# Patient Record
Sex: Female | Born: 2000 | Hispanic: Yes | Marital: Single | Smoking: Never smoker
Health system: Southern US, Community
[De-identification: ages and names within clinical notes are randomized; demographics above are authoritative.]

## PROBLEM LIST (undated history)

## (undated) DIAGNOSIS — R519 Headache, unspecified: Secondary | ICD-10-CM

## (undated) DIAGNOSIS — R55 Syncope and collapse: Secondary | ICD-10-CM

## (undated) HISTORY — DX: Headache, unspecified: R51.9

## (undated) HISTORY — DX: Syncope and collapse: R55

## (undated) HISTORY — PX: OTHER SURGICAL HISTORY: SHX169

---

## 2019-05-27 ENCOUNTER — Ambulatory Visit: Payer: Medicaid Other | Attending: Internal Medicine

## 2019-05-27 DIAGNOSIS — Z23 Encounter for immunization: Secondary | ICD-10-CM

## 2019-05-27 NOTE — Progress Notes (Signed)
   Covid-19 Vaccination Clinic  Name:  Janasia Coverdale    MRN: [id number] DOB: 04-06-2000  05/27/2019  Ms. Henricks was observed post Covid-19 immunization for 15 minutes without incident. She was provided with Vaccine Information Sheet and instruction to access the V-Safe system.   Ms. Sleep was instructed to call 911 with any severe reactions post vaccine: Marland Kitchen Difficulty breathing  . Swelling of face and throat  . A fast heartbeat  . A bad rash all over body  . Dizziness and weakness   Immunizations Administered    Name Date Dose VIS Date Route   Pfizer COVID-19 Vaccine 05/27/2019  4:46 PM 0.3 mL 03/31/2018 Intramuscular   Manufacturer: [employer], [employer]   Lot: [id number]   NDC: [address]-5

## 2019-06-21 ENCOUNTER — Ambulatory Visit: Payer: Medicaid Other | Attending: Internal Medicine

## 2019-06-21 DIAGNOSIS — Z23 Encounter for immunization: Secondary | ICD-10-CM

## 2019-06-21 NOTE — Progress Notes (Signed)
   Covid-19 Vaccination Clinic  Name:  Kaila Devries    MRN: [id number] DOB: [birth year]  06/21/2019  Ms. Saddler was observed post Covid-19 immunization for 15 minutes without incident. She was provided with Vaccine Information Sheet and instruction to access the V-Safe system.   Ms. Ord was instructed to call 911 with any severe reactions post vaccine: Marland Kitchen Difficulty breathing  . Swelling of face and throat  . A fast heartbeat  . A bad rash all over body  . Dizziness and weakness   Immunizations Administered    Name Date Dose VIS Date Route   Pfizer COVID-19 Vaccine 06/21/2019  4:20 PM 0.3 mL 03/31/2018 Intramuscular   Manufacturer: [employer], [employer]   Lot: [id number]   NDC: [address]-3

## 2020-01-18 ENCOUNTER — Ambulatory Visit (INDEPENDENT_AMBULATORY_CARE_PROVIDER_SITE_OTHER): Payer: Medicaid Other

## 2020-01-18 ENCOUNTER — Ambulatory Visit (INDEPENDENT_AMBULATORY_CARE_PROVIDER_SITE_OTHER): Payer: Medicaid Other | Admitting: Cardiology

## 2020-01-18 ENCOUNTER — Other Ambulatory Visit: Payer: Self-pay

## 2020-01-18 ENCOUNTER — Encounter: Payer: Self-pay | Admitting: Cardiology

## 2020-01-18 ENCOUNTER — Ambulatory Visit: Payer: Medicaid Other

## 2020-01-18 VITALS — BP 102/64 | HR 78 | Ht 67.0 in | Wt 167.6 lb

## 2020-01-18 DIAGNOSIS — R55 Syncope and collapse: Secondary | ICD-10-CM

## 2020-01-18 NOTE — Patient Instructions (Signed)
Medication Instructions:  Your physician recommends that you continue on your current medications as directed. Please refer to the Current Medication list given to you today.  *If you need a refill on your cardiac medications before your next appointment, please call your pharmacy*   Lab Work: NONE If you have labs (blood work) drawn today and your tests are completely normal, you will receive your results only by:  MyChart Message (if you have MyChart) OR  A paper copy in the mail If you have any lab test that is abnormal or we need to change your treatment, we will call you to review the results.   Testing/Procedures: echocardiogram    Follow-Up: At [facility], you and your health needs are our priority.  As part of our continuing mission to provide you with exceptional heart care, we have created designated Provider Care Teams.  These Care Teams include your primary Cardiologist (physician) and Advanced Practice Providers (APPs -  Physician Assistants and Nurse Practitioners) who all work together to provide you with the care you need, when you need it.  We recommend signing up for the patient portal called "MyChart".  Sign up information is provided on this After Visit Summary.  MyChart is used to connect with patients for Virtual Visits (Telemedicine).  Patients are able to view lab/test results, encounter notes, upcoming appointments, etc.  Non-urgent messages can be sent to your provider as well.   To learn more about what you can do with MyChart, go to [email or web address].    Your next appointment:   10 week(s)  The format for your next appointment:   In Person  Provider:   Thomasene Ripple, DO   Other Instructions

## 2020-01-19 NOTE — Progress Notes (Signed)
Cardiology Office Note:    Date:  01/19/2020   ID:  Kristina Malone, DOB 2000-05-09, MRN [id number]  PCP:  Buckner Malta, MD  Cardiologist:  Thomasene Ripple, DO  Electrophysiologist:  None   Referring MD: Buckner Malta, MD   " I passed out at the doctor's office"  History of Present Illness:    Kristina Malone is a 19 y.o. female with a hx of having multiple syncope episodes in the past as a child.  She did wear a monitor and follow-up with [facility] at that time.  She notes that since being a teenager she did not have any repeated syncope episode.  But noted that recently now she is had been experiencing syncope episode when she get blood drawn, and also has had other instances where she had syncope episode.  She tells me that recently she had an episode where she had her first pelvic exam at her PCP office.  During the procedure she felt the sensation and knew that she was going to pass out she did tell her PCP that she felt as if she was going to pass out and she was level down on the bed.  She tells me that her PCP witnessed the event and thought this may have been a seizure.  The patient tells me that her PCP sent her to the hospital for EEG and this was reported to be normal.  Is unclear if she really has seen neurology. She has no chest pain, no shortness of breath.  History reviewed. No pertinent past medical history.  Past Surgical History:  Procedure Laterality Date  . NO PAST SURGICAL HISTORY      Current Medications: No outpatient medications have been marked as taking for the 01/18/20 encounter (Office Visit) with Thomasene Ripple, DO.     Allergies:   Patient has no known allergies.   Social History   Socioeconomic History  . Marital status: Single    Spouse name: Not on file  . Number of children: Not on file  . Years of education: Not on file  . Highest education level: Not on file  Occupational History  . Not on file  Tobacco Use  . Smoking  status: Never Smoker  . Smokeless tobacco: Never Used  Substance and Sexual Activity  . Alcohol use: Not Currently  . Drug use: Never  . Sexual activity: Not on file  Other Topics Concern  . Not on file  Social History Narrative  . Not on file   Social Determinants of Health   Financial Resource Strain: Not on file  Food Insecurity: Not on file  Transportation Needs: Not on file  Physical Activity: Not on file  Stress: Not on file  Social Connections: Not on file     Family History: The patient's Family history is unknown by patient.  ROS:   Review of Systems  Constitution: Recent syncope episode.  Negative for decreased appetite, fever and weight gain.  HENT: Negative for congestion, ear discharge, hoarse voice and sore throat.   Eyes: Negative for discharge, redness, vision loss in right eye and visual halos.  Cardiovascular: Negative for chest pain, dyspnea on exertion, leg swelling, orthopnea and palpitations.  Respiratory: Negative for cough, hemoptysis, shortness of breath and snoring.   Endocrine: Negative for heat intolerance and polyphagia.  Hematologic/Lymphatic: Negative for bleeding problem. Does not bruise/bleed easily.  Skin: Negative for flushing, nail changes, rash and suspicious lesions.  Musculoskeletal: Negative for arthritis, joint pain, muscle cramps, myalgias, neck  pain and stiffness.  Gastrointestinal: Negative for abdominal pain, bowel incontinence, diarrhea and excessive appetite.  Genitourinary: Negative for decreased libido, genital sores and incomplete emptying.  Neurological: Negative for brief paralysis, focal weakness, headaches and loss of balance.  Psychiatric/Behavioral: Negative for altered mental status, depression and suicidal ideas.  Allergic/Immunologic: Negative for HIV exposure and persistent infections.    EKGs/Labs/Other Studies Reviewed:    The following studies were reviewed today:   EKG:  The ekg ordered today demonstrates  sinus rhythm, heart rate 78 bpm with sinus arrhythmia.   Recent Labs: No results found for requested labs within last 8760 hours.  Recent Lipid Panel No results found for: CHOL, TRIG, HDL, CHOLHDL, VLDL, LDLCALC, LDLDIRECT  Physical Exam:    VS:  BP 102/64 (BP Location: Left Arm)   Pulse 78   Ht 5\' 7"  (1.702 m)   Wt 167 lb 9.6 oz (76 kg)   SpO2 99%   BMI 26.25 kg/m     Wt Readings from Last 3 Encounters:  01/18/20 167 lb 9.6 oz (76 kg)     GEN: Well nourished, well developed in no acute distress HEENT: Normal NECK: No JVD; No carotid bruits LYMPHATICS: No lymphadenopathy CARDIAC: S1S2 noted,RRR, no murmurs, rubs, gallops RESPIRATORY:  Clear to auscultation without rales, wheezing or rhonchi  ABDOMEN: Soft, non-tender, non-distended, +bowel sounds, no guarding. EXTREMITIES: No edema, No cyanosis, no clubbing MUSCULOSKELETAL:  No deformity  SKIN: Warm and dry NEUROLOGIC:  Alert and oriented x 3, non-focal PSYCHIATRIC:  Normal affect, good insight  ASSESSMENT:    1. Syncope and collapse    PLAN:     1.  Her syncope events does sound vasovagal however I would like to rule out a cardiovascular etiology of this syncope, therefore at this time I would like to placed a zio patch for 14 days. In additon a transthoracic echocardiogram will be ordered to assess LV/RV function and any structural abnormalities. Once these testing have been performed amd reviewed further reccomendations will be made. For now, I do reccomend that the patient goes to the nearest ED if  symptoms recur.  The patient is in agreement with the above plan. The patient left the office in stable condition.  The patient will follow up in 3 months or sooner if needed.   Medication Adjustments/Labs and Tests Ordered: Current medicines are reviewed at length with the patient today.  Concerns regarding medicines are outlined above.  Orders Placed This Encounter  Procedures  . LONG TERM MONITOR-LIVE TELEMETRY  (3-14 DAYS)  . EKG 12-Lead  . ECHOCARDIOGRAM COMPLETE   No orders of the defined types were placed in this encounter.   Patient Instructions  Medication Instructions:  Your physician recommends that you continue on your current medications as directed. Please refer to the Current Medication list given to you today.  *If you need a refill on your cardiac medications before your next appointment, please call your pharmacy*   Lab Work: NONE If you have labs (blood work) drawn today and your tests are completely normal, you will receive your results only by: 01/20/20 MyChart Message (if you have MyChart) OR . A paper copy in the mail If you have any lab test that is abnormal or we need to change your treatment, we will call you to review the results.   Testing/Procedures: echocardiogram    Follow-Up: At [facility], you and your health needs are our priority.  As part of our continuing mission to provide you with exceptional heart  care, we have created designated Provider Care Teams.  These Care Teams include your primary Cardiologist (physician) and Advanced Practice Providers (APPs -  Physician Assistants and Nurse Practitioners) who all work together to provide you with the care you need, when you need it.  We recommend signing up for the patient portal called "MyChart".  Sign up information is provided on this After Visit Summary.  MyChart is used to connect with patients for Virtual Visits (Telemedicine).  Patients are able to view lab/test results, encounter notes, upcoming appointments, etc.  Non-urgent messages can be sent to your provider as well.   To learn more about what you can do with MyChart, go to [email or web address].    Your next appointment:   10 week(s)  The format for your next appointment:   In Person  Provider:   Thomasene Ripple, DO   Other Instructions      Adopting a Healthy Lifestyle.  Know what a healthy weight is for you (roughly BMI <25) and aim  to maintain this   Aim for 7+ servings of fruits and vegetables daily   65-80+ fluid ounces of water or unsweet tea for healthy kidneys   Limit to max 1 drink of alcohol per day; avoid smoking/tobacco   Limit animal fats in diet for cholesterol and heart health - choose grass fed whenever available   Avoid highly processed foods, and foods high in saturated/trans fats   Aim for low stress - take time to unwind and care for your mental health   Aim for 150 min of moderate intensity exercise weekly for heart health, and weights twice weekly for bone health   Aim for 7-9 hours of sleep daily   When it comes to diets, agreement about the perfect plan isnt easy to find, even among the experts. Experts at the [facility] [employer] developed an idea known as the Healthy Eating Plate. Just imagine a plate divided into logical, healthy portions.   The emphasis is on diet quality:   Load up on vegetables and fruits - one-half of your plate: Aim for color and variety, and remember that potatoes dont count.   Go for whole grains - one-quarter of your plate: Whole wheat, barley, wheat berries, quinoa, oats, brown rice, and foods made with them. If you want pasta, go with whole wheat pasta.   Protein power - one-quarter of your plate: Fish, chicken, beans, and nuts are all healthy, versatile protein sources. Limit red meat.   The diet, however, does go beyond the plate, offering a few other suggestions.   Use healthy Malone oils, such as olive, canola, soy, corn, sunflower and peanut. Check the labels, and avoid partially hydrogenated oil, which have unhealthy trans fats.   If youre thirsty, drink water. Coffee and tea are good in moderation, but skip sugary drinks and limit milk and dairy products to one or two daily servings.   The type of carbohydrate in the diet is more important than the amount. Some sources of carbohydrates, such as vegetables, fruits, whole grains, and  beans-are healthier than others.   Finally, stay active  Signed, Thomasene Ripple, DO  01/19/2020 9:43 AM    [facility]

## 2020-02-15 ENCOUNTER — Other Ambulatory Visit: Payer: Self-pay | Admitting: Cardiology

## 2020-02-15 DIAGNOSIS — R55 Syncope and collapse: Secondary | ICD-10-CM

## 2020-02-18 ENCOUNTER — Telehealth: Payer: Self-pay

## 2020-02-18 NOTE — Telephone Encounter (Signed)
-----   Message from Thomasene Ripple, DO sent at 02/17/2020 10:32 PM EST ----- Have the patient come earlier than feb to discuss her testing results.

## 2020-02-18 NOTE — Telephone Encounter (Signed)
Left message on patients voicemail to please return our call.   

## 2020-02-23 ENCOUNTER — Ambulatory Visit (INDEPENDENT_AMBULATORY_CARE_PROVIDER_SITE_OTHER): Payer: Medicaid Other

## 2020-02-23 ENCOUNTER — Other Ambulatory Visit: Payer: Self-pay

## 2020-02-23 DIAGNOSIS — R55 Syncope and collapse: Secondary | ICD-10-CM

## 2020-02-23 LAB — ECHOCARDIOGRAM COMPLETE
Area-P 1/2: 3.6 cm2
S' Lateral: 3 cm

## 2020-02-23 NOTE — Progress Notes (Signed)
Complete echocardiogram performed.  Jimmy Yina Riviere RDCS, RVT  

## 2020-02-24 ENCOUNTER — Telehealth: Payer: Self-pay | Admitting: Cardiology

## 2020-02-24 NOTE — Telephone Encounter (Signed)
Patient was returning call to receive her results. Please call back

## 2020-02-24 NOTE — Telephone Encounter (Signed)
The patient has been notified of the result and verbalized understanding.  All questions (if any) were answered. Loa Socks, LPN 5/52/1747 1:59 PM

## 2020-02-24 NOTE — Telephone Encounter (Signed)
-----   Message from Thomasene Ripple, DO sent at 02/24/2020  1:11 PM EST ----- Echo normal

## 2020-03-06 ENCOUNTER — Other Ambulatory Visit: Payer: Self-pay

## 2020-03-07 ENCOUNTER — Ambulatory Visit: Payer: Medicaid Other | Admitting: Cardiology

## 2020-03-13 ENCOUNTER — Other Ambulatory Visit: Payer: Self-pay

## 2020-03-13 ENCOUNTER — Encounter: Payer: Self-pay | Admitting: Neurology

## 2020-03-13 ENCOUNTER — Ambulatory Visit: Payer: Medicaid Other | Admitting: Neurology

## 2020-03-13 VITALS — BP 116/74 | HR 75 | Ht 67.0 in | Wt 166.6 lb

## 2020-03-13 DIAGNOSIS — R4020 Unspecified coma: Secondary | ICD-10-CM | POA: Diagnosis not present

## 2020-03-13 DIAGNOSIS — R55 Syncope and collapse: Secondary | ICD-10-CM

## 2020-03-13 NOTE — Patient Instructions (Signed)
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  She was advised to keep her self well-hydrated and avoid stressful situations which provoke these episodes.  She was advised not to drive for 6 months since her last episode as per [facility].  She will return for follow-up in the future in 3 months or call earlier if necessary.  Syncope Syncope is when you pass out (faint) for a short time. It is caused by a sudden decrease in blood flow to the brain. Signs that you may be about to pass out include:  Feeling dizzy or light-headed.  Feeling sick to your stomach (nauseous).  Seeing all white or all black.  Having cold, clammy skin. If you pass out, get help right away. Call your local emergency services (911 in the [place] Do not drive yourself to the hospital. Follow these instructions at home: Watch for any changes in your symptoms. Take these actions to stay safe and help with your symptoms: Lifestyle  Do not drive, use machinery, or play sports until your doctor says it is okay.  Do not drink alcohol.  Do not use any products that contain nicotine or tobacco, such as cigarettes and e-cigarettes. If you need help quitting, ask your doctor.  Drink enough fluid to keep your pee (urine) pale yellow. General instructions  Take over-the-counter and prescription medicines only as told by your doctor.  If you are taking blood pressure or heart medicine, sit up and stand up slowly. Spend a few minutes getting ready to sit and then stand. This can help you feel less dizzy.  Have someone stay with you until you feel stable.  If you start to feel like you might pass out, lie down right away and raise (elevate)  your feet above the level of your heart. Breathe deeply and steadily. Wait until all of the symptoms are gone.  Keep all follow-up visits as told by your doctor. This is important. Get help right away if:  You have a very bad headache.  You pass out once or more than once.  You have pain in your chest, belly, or back.  You have a very fast or uneven heartbeat (palpitations).  It hurts to breathe.  You are bleeding from your mouth or your bottom (rectum).  You have black or tarry poop (stool).  You have jerky movements that you cannot control (seizure).  You are confused.  You have trouble walking.  You are very weak.  You have vision problems. These symptoms may be an emergency. Do not wait to see if the symptoms will go away. Get medical help right away. Call your local emergency services (911 in the [place] Do not drive yourself to the hospital. Summary  Syncope is when you pass out (faint) for a short time. It is caused by a sudden decrease in blood flow to the brain.  Signs that you may be about to faint include feeling dizzy, light-headed, or sick to your stomach, seeing all white or all black, or having cold, clammy skin.  If you start to feel like you might pass out, lie down right away and raise (elevate) your feet above the level of your heart. Breathe deeply and steadily. Wait until  all of the symptoms are gone. This information is not intended to replace advice given to you by your health care provider. Make sure you discuss any questions you have with your health care provider. Document Revised: 03/03/2019 Document Reviewed: 03/05/2017 Elsevier Patient Education  2021 [employer].

## 2020-03-13 NOTE — Progress Notes (Addendum)
[facility] [address] [place]. [place] [address] [phone]       OFFICE CONSULT NOTE  [name] Date of Birth:  2000-03-09 Medical Record Number:  [id number]   Referring MD: Bonna Gains Reason for Referral: Seizure  HPI: [name] is a pleasant 20 year old Hispanic lady seen for initial office consultation visit for seizure.  History is obtained from the patient and review of referral notes and have reviewed electronic medical records.  Patient states she had a recent episode in December 2021 when she was at her primary care physician's office having a pelvic exam with and after the exam she passed out.  This episode was more prolonged than prior episodes and the nurse practitioner witnessed some jerking activity raising concerns for seizure prompting this referral.  Patient states she remembers exactly what happened and she felt uncomfortable and had been feeling quite anxious and nervous prior to the pelvic exam.  She regained consciousness fairly quickly and had a clear mind and denied any disc disorientation, confusion or headache.  Patient states she has had multiple previous episodes of syncope.  Most of these episodes occur when she is in a stressful situation like site of needles or having labs drawn.  She has an aura and start feeling hot and dizzy and nervous and know she is going to pass out.  Patient states that she had a somewhat similar episode in 2013 when she remembers she was worked up at [facility] but I do not have those records.  She tells me that she wore a heart monitor for 4 weeks which was negative as well as had an EEG which was supposedly normal.  He has not been evaluated by neurologist and does not remember having a brain MRI done.  She did have an echocardiogram done on 02/23/2020 in [place] which was normal as well as wore a 2-week Holter monitor on 02/15/2020 for 2 weeks which showed no significant worrisome arrhythmias.   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  She denies any prior history of strokes, TIAs, seizures, migraines or significant head injury resulting in loss of consciousness.  There is no family history of epilepsy or seizures. ROS:   14 system review of systems is positive for passing out, shaking, seizure-like activity, feeling hot and dizzy and all other systems negative  PMH:  Past Medical History:  Diagnosis Date  . Headache     Social History:  Social History   Socioeconomic History  . Marital status: Single    Spouse name: Not on file  . Number of children: Not on file  . Years of education: Not on file  . Highest education level: Not on file  Occupational History  . 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 Smoking status: Never Smoker  . Smokeless tobacco: Never Used  Substance and Sexual Activity  . Alcohol use: Not Currently  . Drug use: Never  . Sexual activity: Not on file  Other Topics Concern  . Not on file  Social History Narrative   Lives with mom and stepdad   Left Handed   Drinks 1-2 cups daily   Social Determinants of Health   Financial Resource Strain: Not on file  Food Insecurity: Not on file  Transportation Needs: Not on file  Physical Activity: Not on file  Stress: Not on file  Social Connections: Not on file  Intimate Partner Violence: Not on file  Medications:   Current Outpatient Medications on File Prior to Visit  Medication Sig Dispense Refill  . ibuprofen (ADVIL) 800 MG tablet Take 800 mg by mouth 2 (two) times daily as needed.     No current facility-administered medications on file prior to visit.    Allergies:  No Known Allergies  Physical Exam General: well developed, well nourished young Hispanic lady, seated, in no evident distress Head: head normocephalic and atraumatic.   Neck: supple with no carotid or supraclavicular bruits Cardiovascular: regular rate and rhythm, no  murmurs Musculoskeletal: no deformity Skin:  no rash/petichiae Vascular:  Normal pulses all extremities  Neurologic Exam Mental Status: Awake and fully alert. Oriented to place and time. Recent and remote memory intact. Attention span, concentration and fund of knowledge appropriate. Mood and affect appropriate.  Cranial Nerves: Fundoscopic exam reveals sharp disc margins. Pupils equal, briskly reactive to light. Extraocular movements full without nystagmus. Visual fields full to confrontation. Hearing intact. Facial sensation intact. Face, tongue, palate moves normally and symmetrically.  Motor: Normal bulk and tone. Normal strength in all tested extremity muscles. Sensory.: intact to touch , pinprick , position and vibratory sensation.  Coordination: Rapid alternating movements normal in all extremities. Finger-to-nose and heel-to-shin performed accurately bilaterally. Gait and Station: Arises from chair without difficulty. Stance is normal. Gait demonstrates normal stride length and balance . Able to heel, toe and tandem walk without difficulty.  Reflexes: 1+ and symmetric. Toes downgoing.       ASSESSMENT: 20 year old Hispanic lady with a longstanding history of syncopal episodes with recent episode of witnessed convulsive syncope.  Most of the episodes seem to have typical triggers for vasovagal syncope.  Doubt this represents seizure.     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  She was advised to keep her self well-hydrated and avoid stressful situations which provoke these episodes.  She was advised not to drive for 6 months since her last episode as per [facility].  She will return for  follow-up in the future in 3 months or call earlier if necessary.  Greater than 50% time during this 45-minute consultation visit was spent on counseling and coordination of care about her syncope and convulsive syncope and answering questions. Delia Heady, MD  [facility] [address] [place], [place] [address]  Phone [phone] Fax [phone] Note: This document was prepared with digital dictation and possible smart phrase technology. Any transcriptional errors that result from this process are unintentional.

## 2020-03-14 ENCOUNTER — Telehealth: Payer: Self-pay | Admitting: Neurology

## 2020-03-14 NOTE — Telephone Encounter (Signed)
[facility] [place]: [phone], [phone] & [phone] (exp. 03/14/20 to 04/28/20) order sent to GI. They will reach out to the patient to schedule.

## 2020-03-15 ENCOUNTER — Ambulatory Visit (INDEPENDENT_AMBULATORY_CARE_PROVIDER_SITE_OTHER): Payer: Medicaid Other | Admitting: Neurology

## 2020-03-15 ENCOUNTER — Other Ambulatory Visit: Payer: Self-pay

## 2020-03-15 DIAGNOSIS — R55 Syncope and collapse: Secondary | ICD-10-CM | POA: Diagnosis not present

## 2020-03-15 DIAGNOSIS — R4020 Unspecified coma: Secondary | ICD-10-CM

## 2020-03-21 NOTE — Progress Notes (Signed)
Kindly inform the patient that EEG study was normal

## 2020-03-23 ENCOUNTER — Telehealth: Payer: Self-pay | Admitting: *Deleted

## 2020-03-23 NOTE — Telephone Encounter (Signed)
Spoke with patient and advised per Dr Pearlean Brownie that her EEG was normal. Pt verbalized understanding and appreciation for the call.

## 2020-03-25 ENCOUNTER — Ambulatory Visit
Admission: RE | Admit: 2020-03-25 | Discharge: 2020-03-25 | Disposition: A | Discharge status: Home / Self Care | Payer: Medicaid Other | Source: Ambulatory Visit | Attending: Neurology | Admitting: Neurology

## 2020-03-25 ENCOUNTER — Other Ambulatory Visit: Payer: Self-pay

## 2020-03-25 DIAGNOSIS — R55 Syncope and collapse: Secondary | ICD-10-CM | POA: Diagnosis not present

## 2020-03-25 MED ORDER — GADOBENATE DIMEGLUMINE 529 MG/ML IV SOLN
15.0000 mL | Freq: Once | INTRAVENOUS | Status: AC | PRN
  Administered 2020-03-25: 15 mL via INTRAVENOUS

## 2020-03-27 ENCOUNTER — Telehealth: Payer: Self-pay | Admitting: Emergency Medicine

## 2020-03-27 NOTE — Telephone Encounter (Signed)
Called and reached VM, LVM with Dr. Marlis Edelson review and findings of MRI's per DPR.  Left office number to call back if she had any additional questions.

## 2020-03-27 NOTE — Progress Notes (Signed)
Kindly inform the patient that MRI scan of the brain was pretty unremarkable and did not show anything to worry about.  MR angiogram study of the blood vessels in the neck and the brain also did not show any major blockages or aneurysms to worry about.

## 2020-03-27 NOTE — Telephone Encounter (Signed)
-----   Message from Micki Riley, MD sent at 03/27/2020  8:39 AM EST ----- Kindly inform the patient that MRI scan of the brain was pretty unremarkable and did not show anything to worry about.  MR angiogram study of the blood vessels in the neck and the brain also did not show any major blockages or aneurysms to worry about.

## 2020-03-30 ENCOUNTER — Other Ambulatory Visit: Payer: Self-pay

## 2020-03-30 DIAGNOSIS — R519 Headache, unspecified: Secondary | ICD-10-CM | POA: Insufficient documentation

## 2020-04-03 ENCOUNTER — Encounter: Payer: Self-pay | Admitting: Cardiology

## 2020-04-03 ENCOUNTER — Other Ambulatory Visit: Payer: Self-pay

## 2020-04-03 ENCOUNTER — Ambulatory Visit (INDEPENDENT_AMBULATORY_CARE_PROVIDER_SITE_OTHER): Payer: Medicaid Other | Admitting: Cardiology

## 2020-04-03 VITALS — BP 116/68 | HR 60 | Ht 67.0 in | Wt 167.4 lb

## 2020-04-03 DIAGNOSIS — R55 Syncope and collapse: Secondary | ICD-10-CM | POA: Diagnosis not present

## 2020-04-03 NOTE — Patient Instructions (Signed)

## 2020-04-03 NOTE — Progress Notes (Signed)
Cardiology Office Note:    Date:  04/03/2020   ID:  Kristina Malone, DOB 2000/04/09, MRN [id number]  PCP:  Buckner Malta, MD  Cardiologist:  Thomasene Ripple, DO  Electrophysiologist:  None   Referring MD: Buckner Malta, MD   I am doing a lot better, has not had any syncope episodes since the last seen you  History of Present Illness:    Kristina Malone is a 20 y.o. female with a hx of multiple syncope episode as a child presented to be evaluated for syncope episode when she was drawing blood.  I did place a monitor the patient echocardiogram which are reported to be normal.  She also did see neurology who suspected convulsive syncope that MRI and EEG which all come back to be normal.  Thankfully since I last saw her she has not had any syncope episodes.  Past Medical History:  Diagnosis Date  . Headache     Past Surgical History:  Procedure Laterality Date  . NO PAST SURGICAL HISTORY      Current Medications: Current Meds  Medication Sig  . ibuprofen (ADVIL) 800 MG tablet Take 800 mg by mouth 2 (two) times daily as needed.  . sulfamethoxazole-trimethoprim (BACTRIM DS) 800-160 MG tablet Take 1 tablet by mouth 2 (two) times daily.     Allergies:   Patient has no known allergies.   Social History   Socioeconomic History  . Marital status: Single    Spouse name: Not on file  . Number of children: Not on file  . Years of education: Not on file  . Highest education level: Not on file  Occupational History  . 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 Smoking status: Never Smoker  . Smokeless tobacco: Never Used  Substance and Sexual Activity  . Alcohol use: Not Currently  . Drug use: Never  . Sexual activity: Not on file  Other Topics Concern  . Not on file  Social History Narrative   Lives with mom and stepdad   Left Handed   Drinks 1-2 cups daily   Social Determinants of Health   Financial Resource Strain: Not on  file  Food Insecurity: Not on file  Transportation Needs: Not on file  Physical Activity: Not on file  Stress: Not on file  Social Connections: Not on file     Family History: The patient's Family history is unknown by patient.  ROS:   Review of Systems  Constitution: Negative for decreased appetite, fever and weight gain.  HENT: Negative for congestion, ear discharge, hoarse voice and sore throat.   Eyes: Negative for discharge, redness, vision loss in right eye and visual halos.  Cardiovascular: Negative for chest pain, dyspnea on exertion, leg swelling, orthopnea and palpitations.  Respiratory: Negative for cough, hemoptysis, shortness of breath and snoring.   Endocrine: Negative for heat intolerance and polyphagia.  Hematologic/Lymphatic: Negative for bleeding problem. Does not bruise/bleed easily.  Skin: Negative for flushing, nail changes, rash and suspicious lesions.  Musculoskeletal: Negative for arthritis, joint pain, muscle cramps, myalgias, neck pain and stiffness.  Gastrointestinal: Negative for abdominal pain, bowel incontinence, diarrhea and excessive appetite.  Genitourinary: Negative for decreased libido, genital sores and incomplete emptying.  Neurological: Negative for brief paralysis, focal weakness, headaches and loss of balance.  Psychiatric/Behavioral: Negative for altered mental status, depression and suicidal ideas.  Allergic/Immunologic: Negative for HIV exposure and persistent infections.    EKGs/Labs/Other Studies Reviewed:    The  following studies were reviewed today:   EKG:  The ekg ordered today demonstrates   ZIO monitor The patient wore the monitor for 13 days 18 hours starting January 18, 2020. Indication: Syncope  The minimum heart rate was 29 bpm, maximum heart rate was 169 bpm, and average heart rate was 80 bpm. Predominant underlying rhythm was Sinus Rhythm.  Second Degree AV Block-Mobitz I (Wenckebach) was present, asymptomatic  occurring at 9:22 AM on January 19, 2020.  Premature atrial complexes were rare less than 1%. Premature Ventricular complexes were rare less than 1%.  No ventricular tachycardia, no pauses, type III AV block, no supraventricular tachycardia and no atrial fibrillation present. 11 patient triggered events are associated with sinus rhythm.   Conclusion: There is asymptomatic second-degree AV block-Mobitz 1.  Transthoracic  Echocardiogram IMPRESSIONS 02/23/2020 1. Left ventricular ejection fraction, by estimation, is 60 to 65%. The  left ventricle has normal function. The left ventricle has no regional  wall motion abnormalities. Left ventricular diastolic parameters were  normal.  2. Right ventricular systolic function is normal. The right ventricular  size is normal. There is normal pulmonary artery systolic pressure.  3. The mitral valve is normal in structure. No evidence of mitral valve  regurgitation. No evidence of mitral stenosis.  4. The aortic valve is normal in structure. Aortic valve regurgitation is  not visualized. No aortic stenosis is present.  5. The inferior vena cava is normal in size with greater than 50%  respiratory variability, suggesting right atrial pressure of 3 mmHg.   FINDINGS  Left Ventricle: Left ventricular ejection fraction, by estimation, is 60  to 65%. The left ventricle has normal function. The left ventricle has no  regional wall motion abnormalities. The left ventricular internal cavity  size was normal in size. There is  borderline left ventricular hypertrophy. Left ventricular diastolic  parameters were normal. Normal left ventricular filling pressure.   Right Ventricle: The right ventricular size is normal. No increase in  right ventricular wall thickness. Right ventricular systolic function is  normal. There is normal pulmonary artery systolic pressure. The tricuspid  regurgitant velocity is 1.86 m/s, and  with an assumed right  atrial pressure of 3 mmHg, the estimated right  ventricular systolic pressure is 16.8 mmHg.   Left Atrium: Left atrial size was normal in size.   Right Atrium: Right atrial size was normal in size.   Pericardium: There is no evidence of pericardial effusion.   Mitral Valve: The mitral valve is normal in structure. No evidence of  mitral valve regurgitation. No evidence of mitral valve stenosis.   Tricuspid Valve: The tricuspid valve is normal in structure. Tricuspid  valve regurgitation is trivial. No evidence of tricuspid stenosis.   Aortic Valve: The aortic valve is normal in structure. Aortic valve  regurgitation is not visualized. No aortic stenosis is present.   Pulmonic Valve: The pulmonic valve was normal in structure. Pulmonic valve  regurgitation is not visualized. No evidence of pulmonic stenosis.   Aorta: The aortic root is normal in size and structure.   Venous: The inferior vena cava is normal in size with greater than 50%  respiratory variability, suggesting right atrial pressure of 3 mmHg.   IAS/Shunts: No atrial level shunt detected by color flow Doppler.   Recent Labs: No results found for requested labs within last 8760 hours.  Recent Lipid Panel No results found for: CHOL, TRIG, HDL, CHOLHDL, VLDL, LDLCALC, LDLDIRECT  Physical Exam:    VS:  BP 116/68  Pulse 60   Ht 5\' 7"  (1.702 m)   Wt 167 lb 6.4 oz (75.9 kg)   SpO2 98%   BMI 26.22 kg/m     Wt Readings from Last 3 Encounters:  04/03/20 167 lb 6.4 oz (75.9 kg)  03/13/20 166 lb 9.6 oz (75.6 kg)  01/18/20 167 lb 9.6 oz (76 kg)     GEN: Well nourished, well developed in no acute distress HEENT: Normal NECK: No JVD; No carotid bruits LYMPHATICS: No lymphadenopathy CARDIAC: S1S2 noted,RRR, no murmurs, rubs, gallops RESPIRATORY:  Clear to auscultation without rales, wheezing or rhonchi  ABDOMEN: Soft, non-tender, non-distended, +bowel sounds, no guarding. EXTREMITIES: No edema, No cyanosis, no  clubbing MUSCULOSKELETAL:  No deformity  SKIN: Warm and dry NEUROLOGIC:  Alert and oriented x 3, non-focal PSYCHIATRIC:  Normal affect, good insight  ASSESSMENT:    1. Syncope and collapse    PLAN:     1.  She appears to be doing well from a cardiovascular standpoint.  Of her testing has been pretty much normal.  Discussed the finding of her ZIO monitor which show an episode of type I second-degree AV block.  Which is suspected to be vagally mediated.  I explained to the patient what this means.  All of her questions has been answered.  No further testing from a cardiovascular standpoint  The patient is in agreement with the above plan. The patient left the office in stable condition.  The patient will follow up in as needed   Medication Adjustments/Labs and Tests Ordered: Current medicines are reviewed at length with the patient today.  Concerns regarding medicines are outlined above.  No orders of the defined types were placed in this encounter.  No orders of the defined types were placed in this encounter.   Patient Instructions  Medication Instructions:  Your physician recommends that you continue on your current medications as directed. Please refer to the Current Medication list given to you today.  *If you need a refill on your cardiac medications before your next appointment, please call your pharmacy*   Lab Work: None If you have labs (blood work) drawn today and your tests are completely normal, you will receive your results only by: 01/20/20 MyChart Message (if you have MyChart) OR . A paper copy in the mail If you have any lab test that is abnormal or we need to change your treatment, we will call you to review the results.   Testing/Procedures: None   Follow-Up: At [facility], you and your health needs are our priority.  As part of our continuing mission to provide you with exceptional heart care, we have created designated Provider Care Teams.  These Care  Teams include your primary Cardiologist (physician) and Advanced Practice Providers (APPs -  Physician Assistants and Nurse Practitioners) who all work together to provide you with the care you need, when you need it.  We recommend signing up for the patient portal called "MyChart".  Sign up information is provided on this After Visit Summary.  MyChart is used to connect with patients for Virtual Visits (Telemedicine).  Patients are able to view lab/test results, encounter notes, upcoming appointments, etc.  Non-urgent messages can be sent to your provider as well.   To learn more about what you can do with MyChart, go to [facility].    Your next appointment:   As needed  The format for your next appointment:   In Person  Provider:   [email or web address], DO  Other Instructions      Adopting a Healthy Lifestyle.  Know what a healthy weight is for you (roughly BMI <25) and aim to maintain this   Aim for 7+ servings of fruits and vegetables daily   65-80+ fluid ounces of water or unsweet tea for healthy kidneys   Limit to max 1 drink of alcohol per day; avoid smoking/tobacco   Limit animal fats in diet for cholesterol and heart health - choose grass fed whenever available   Avoid highly processed foods, and foods high in saturated/trans fats   Aim for low stress - take time to unwind and care for your mental health   Aim for 150 min of moderate intensity exercise weekly for heart health, and weights twice weekly for bone health   Aim for 7-9 hours of sleep daily   When it comes to diets, agreement about the perfect plan isnt easy to find, even among the experts. Experts at the [facility] [employer] developed an idea known as the Healthy Eating Plate. Just imagine a plate divided into logical, healthy portions.   The emphasis is on diet quality:   Load up on vegetables and fruits - one-half of your plate: Aim for color and variety, and remember that potatoes  dont count.   Go for whole grains - one-quarter of your plate: Whole wheat, barley, wheat berries, quinoa, oats, brown rice, and foods made with them. If you want pasta, go with whole wheat pasta.   Protein power - one-quarter of your plate: Fish, chicken, beans, and nuts are all healthy, versatile protein sources. Limit red meat.   The diet, however, does go beyond the plate, offering a few other suggestions.   Use healthy plant oils, such as olive, canola, soy, corn, sunflower and peanut. Check the labels, and avoid partially hydrogenated oil, which have unhealthy trans fats.   If youre thirsty, drink water. Coffee and tea are good in moderation, but skip sugary drinks and limit milk and dairy products to one or two daily servings.   The type of carbohydrate in the diet is more important than the amount. Some sources of carbohydrates, such as vegetables, fruits, whole grains, and beans-are healthier than others.   Finally, stay active  Signed, Thomasene Ripple, DO  04/03/2020 8:25 AM    [facility]

## 2020-07-05 ENCOUNTER — Ambulatory Visit: Payer: Medicaid Other | Admitting: Adult Health

## 2020-07-05 ENCOUNTER — Encounter: Payer: Self-pay | Admitting: Neurology

## 2020-07-05 ENCOUNTER — Ambulatory Visit: Payer: Medicaid Other | Admitting: Neurology

## 2021-03-02 ENCOUNTER — Encounter: Payer: Self-pay | Admitting: Cardiology

## 2021-03-02 ENCOUNTER — Encounter: Payer: Self-pay | Admitting: *Deleted

## 2021-03-09 DIAGNOSIS — R55 Syncope and collapse: Secondary | ICD-10-CM | POA: Insufficient documentation

## 2021-03-16 ENCOUNTER — Emergency Department (HOSPITAL_COMMUNITY): Payer: Medicaid Other

## 2021-03-16 ENCOUNTER — Emergency Department (HOSPITAL_COMMUNITY)
Admission: EM | Admit: 2021-03-16 | Discharge: 2021-03-17 | Disposition: A | Discharge status: Home / Self Care | Payer: Medicaid Other | Attending: Emergency Medicine | Admitting: Emergency Medicine

## 2021-03-16 ENCOUNTER — Other Ambulatory Visit: Payer: Self-pay

## 2021-03-16 ENCOUNTER — Encounter (HOSPITAL_COMMUNITY): Payer: Self-pay | Admitting: Emergency Medicine

## 2021-03-16 DIAGNOSIS — R0789 Other chest pain: Secondary | ICD-10-CM | POA: Diagnosis not present

## 2021-03-16 DIAGNOSIS — R079 Chest pain, unspecified: Secondary | ICD-10-CM | POA: Diagnosis present

## 2021-03-16 NOTE — ED Triage Notes (Signed)
Pt reported to ED with left sides chest pain that is exacerbated with movement since approximately 1800. Pt denies any ShOB but reports feeling "tightness" while exhaling. Denies any respiratory hx or n/v.

## 2021-03-16 NOTE — ED Provider Triage Note (Signed)
Emergency Medicine Provider Triage Evaluation Note  Kristina Malone , a 21 y.o. female  was evaluated in triage.  Pt complains of chest pain.  Onset 6 PM tonight, left-sided, worse with arm movement, no nausea, vomiting, diuresis or shortness of breath.  No family history of CAD/MI, smoking history, high cholesterol, hypertension, hyperlipidemia.  Review of Systems  Positive: cp Negative: sob  Physical Exam  BP (!) 128/59    Pulse 70    Temp 98.3 F (36.8 C) (Oral)    Resp 16    SpO2 100%  Gen:   Awake, no distress   Resp:  Normal effort  MSK:   Moves extremities without difficulty  Other:  Reproducible left-sided chest wall pressure with movement of the left arm  Medical Decision Making  Medically screening exam initiated at 10:55 PM.  Appropriate orders placed.  Kristina Malone was informed that the remainder of the evaluation will be completed by another provider, this initial triage assessment does not replace that evaluation, and the importance of remaining in the ED until their evaluation is complete.  Suspect chest wall pain, work-up pending   Margarita Mail, PA-C 03/16/21 2256

## 2021-03-17 MED ORDER — IBUPROFEN 400 MG PO TABS
400.0000 mg | ORAL_TABLET | Freq: Once | ORAL | Status: AC
Start: 2021-03-17 — End: 2021-03-17
  Administered 2021-03-17: 400 mg via ORAL
  Filled 2021-03-17: qty 1

## 2021-03-17 NOTE — Discharge Instructions (Addendum)

## 2021-03-17 NOTE — ED Provider Notes (Signed)
[facility] EMERGENCY DEPARTMENT Provider Note   CSN: [id number] Arrival date & time: 03/16/21  2145     History  Chief Complaint  Patient presents with   Chest Pain    Kristina Malone is a 21 y.o. female.  The history is provided by the patient.  Chest Pain Pain location:  L chest Pain quality: pressure   Pain severity:  Moderate Onset quality:  Sudden Duration:  5 hours Timing:  Constant Progression:  Unchanged Chronicity:  New Relieved by:  Rest Worsened by:  Certain positions and movement Associated symptoms: no abdominal pain, no fever, no lower extremity edema, no shortness of breath, no syncope and no vomiting   Risk factors: no birth control, no prior DVT/PE and no smoking   Patient reports onset of chest pain to the left chest approximately 5 hours ago.  It is worse with movement and palpation.  It does not radiate into the back.  It will sometimes hurt with breathing.  No cough.  No abdominal pain.  No new lower extremity edema.  Patient has history of vasovagal syncope, last episode was over 2-3 weeks ago She has had extensive work-up as an outpatient with cardiology.     Past Medical History:  Diagnosis Date   Headache    Vasovagal syncope     Home Medications Prior to Admission medications   Not on File      Allergies    Patient has no known allergies.    Review of Systems   Review of Systems  Constitutional:  Negative for fever.  Respiratory:  Negative for shortness of breath.   Cardiovascular:  Positive for chest pain. Negative for leg swelling and syncope.  Gastrointestinal:  Negative for abdominal pain and vomiting.  All other systems reviewed and are negative.  Physical Exam Updated Vital Signs BP (!) 128/59    Pulse 70    Temp 98.3 F (36.8 C) (Oral)    Resp 16    SpO2 100%  Physical Exam CONSTITUTIONAL: Well developed/well nourished HEAD: Normocephalic/atraumatic EYES: EOMI/PERRL ENMT: Mucous membranes  moist NECK: supple no meningeal signs SPINE/BACK:entire spine nontender CV: S1/S2 noted, no murmurs/rubs/gallops noted LUNGS: Lungs are clear to auscultation bilaterally, no apparent distress Chest-pain is reproduced with movement of the left arm.  Pain is also reproduced with movement of the upper body ABDOMEN: soft, nontender, no rebound or guarding, bowel sounds noted throughout abdomen GU:no cva tenderness NEURO: Pt is awake/alert/appropriate, moves all extremitiesx4.  No facial droop.   EXTREMITIES: pulses normal/equal, full ROM, no calf tenderness or edema SKIN: warm, color normal PSYCH: no abnormalities of mood noted, alert and oriented to situation  ED Results / Procedures / Treatments   Labs (all labs ordered are listed, but only abnormal results are displayed) Labs Reviewed - No data to display  EKG EKG Interpretation  Date/Time:  Saturday March 17 2021 00:05:39 EST Ventricular Rate:  62 PR Interval:  152 QRS Duration: 92 QT Interval:  408 QTC Calculation: 414 R Axis:   86 Text Interpretation: Normal sinus rhythm with sinus arrhythmia Normal ECG No significant change since 01/2020 Confirmed by Ripley Fraise [phone]) on 03/17/2021 12:10:42 AM  Radiology DG Chest 2 View  Result Date: 03/16/2021 CLINICAL DATA:  Chest pain EXAM: CHEST - 2 VIEW COMPARISON:  None. FINDINGS: The heart size and mediastinal contours are within normal limits. Both lungs are clear. The visualized skeletal structures are unremarkable. IMPRESSION: No active cardiopulmonary disease. Electronically Signed   By: Maudie Mercury  Francoise Ceo M.D.   On: 03/16/2021 23:56    Procedures Procedures    Medications Ordered in ED Medications  ibuprofen (ADVIL) tablet 400 mg (400 mg Oral Given 03/17/21 0023)    ED Course/ Medical Decision Making/ A&P                           Medical Decision Making Amount and/or Complexity of Data Reviewed Radiology: ordered and independent interpretation performed.  Decision-making details documented in ED Course. ECG/medicine tests: ordered and independent interpretation performed. Decision-making details documented in ED Course.  Risk Prescription drug management.   This patient presents to the ED for concern of chest pain, this involves an extensive number of treatment options, and is a complaint that carries with it a high risk of complications and morbidity.  The differential diagnosis includes acute coronary syndrome, pericarditis, pulmonary embolism, aortic dissection, pneumothorax, pneumonia  Comorbidities that complicate the patient evaluation: Patients presentation is complicated by their history of vasovagal syncope  Additional history obtained:  Records reviewed previous cardiology notes reviewed Patient has had extensive work-up for syncope including monitoring and echocardiogram Patient diagnosed with vasovagal syncope  Imaging Studies ordered: I ordered imaging studies including X-ray chest I independently visualized and interpreted imaging which showed no acute findings I agree with the radiologist interpretation   Medicines ordered and prescription drug management: I ordered medication including ibuprofen for chest pain  Test Considered: Patient is low risk / negative by [facility] score, therefore do not feel that D-dimer or CT chest is indicated.    Complexity of problems addressed: Patients presentation is most consistent with  acute complicated illness/injury requiring diagnostic workup      Disposition: After consideration of the diagnostic results and the patients response to treatment,  I feel that the patent would benefit from discharge .   Patient is PERC negative.  Vitals are appropriate. I have low suspicion for ACS/PE/dissection at this time Patient will be discharged home We discussed strict ER return precautions          Final Clinical Impression(s) / ED Diagnoses Final diagnoses:  Chest wall pain     Rx / DC Orders ED Discharge Orders     None         Ripley Fraise, MD 03/17/21 [phone]

## 2021-04-13 ENCOUNTER — Ambulatory Visit (INDEPENDENT_AMBULATORY_CARE_PROVIDER_SITE_OTHER): Payer: Medicaid Other | Admitting: Cardiology

## 2021-04-13 ENCOUNTER — Encounter: Payer: Self-pay | Admitting: Cardiology

## 2021-04-13 ENCOUNTER — Other Ambulatory Visit: Payer: Self-pay

## 2021-04-13 VITALS — BP 112/62 | HR 81 | Ht 67.5 in | Wt 164.0 lb

## 2021-04-13 DIAGNOSIS — R55 Syncope and collapse: Secondary | ICD-10-CM

## 2021-04-13 NOTE — Patient Instructions (Signed)
Medication Instructions:  ?Your physician recommends that you continue on your current medications as directed. Please refer to the Current Medication list given to you today. ? ?*If you need a refill on your cardiac medications before your next appointment, please call your pharmacy* ? ? ?Lab Work: ?No labs today ?If you have labs (blood work) drawn today and your tests are completely normal, you will receive your results only by: ?MyChart Message (if you have MyChart) OR ?A paper copy in the mail ?If you have any lab test that is abnormal or we need to change your treatment, we will call you to review the results. ? ? ?Testing/Procedures: ?No testing ordered ? ? ?Follow-Up: ?At [facility], you and your health needs are our priority.  As part of our continuing mission to provide you with exceptional heart care, we have created designated Provider Care Teams.  These Care Teams include your primary Cardiologist (physician) and Advanced Practice Providers (APPs -  Physician Assistants and Nurse Practitioners) who all work together to provide you with the care you need, when you need it. ? ?We recommend signing up for the patient portal called "MyChart".  Sign up information is provided on this After Visit Summary.  MyChart is used to connect with patients for Virtual Visits (Telemedicine).  Patients are able to view lab/test results, encounter notes, upcoming appointments, etc.  Non-urgent messages can be sent to your provider as well.   ?To learn more about what you can do with MyChart, go to [email or web address].   ? ?Your next appointment:   ?1 year(s) ? ?The format for your next appointment:   ?In Person ? ?Provider:   ?Belva Crome, MD  ? ? ?Other Instructions ?  ?

## 2021-04-13 NOTE — Progress Notes (Signed)
?Cardiology Office Note:   ? ?Date:  04/13/2021  ? ?IDFleta Malone, DOB 14-Dec-2000, MRN [id number] ? ?PCP:  Buckner Malta, MD  ?Cardiologist:  Garwin Brothers, MD  ? ?Referring MD: Buckner Malta, MD  ? ? ?ASSESSMENT:   ? ?1. Vasovagal syncope   ? ?PLAN:   ? ?In order of problems listed above: ? ?Primary prevention stressed with the patient.  Importance of compliance with diet and hydration stressed and she vocalized understanding.  Her symptoms are concerning in the sense that the order of orthostatic hypotension.  I agree with my partner who was evaluated in the past.  In view of syncope driving instructions were given to her.  This will be managed by primary care.  I told her to increase salt and fluid intake in the diet and wear compression stockings and other maneuvers were explained.  This is more important and relevant especially in view of the warm weather coming up soon. ?Patient will be seen in follow-up appointment in 12 months or earlier if the patient has any concerns ? ? ? ?Medication Adjustments/Labs and Tests Ordered: ?Current medicines are reviewed at length with the patient today.  Concerns regarding medicines are outlined above.  ?No orders of the defined types were placed in this encounter. ? ?No orders of the defined types were placed in this encounter. ? ? ? ?No chief complaint on file. ?  ? ?History of Present Illness:   ? ?Kristina Malone is a 21 y.o. female.  Patient has history of vasovagal syncope.  She denies any problems at this time and takes care of activities of daily living.  No chest pain orthopnea or PND.  She tells me that several weeks ago she sat up and felt lightheaded and then lie down and felt better.  Her symptoms are typical of vasovagal oriented times orthostatic hypotension.  At the time of my evaluation, the patient is alert awake oriented and in no distress. ? ?Past Medical History:  ?Diagnosis Date  ? Headache   ? Vasovagal syncope   ? ? ?Past Surgical  History:  ?Procedure Laterality Date  ? NO PAST SURGICAL HISTORY    ? ? ?Current Medications: ?No outpatient medications have been marked as taking for the 04/13/21 encounter (Office Visit) with Ocie Stanzione, Aundra Dubin, MD.  ?  ? ?Allergies:   Patient has no known allergies.  ? ?Social History  ? ?Socioeconomic History  ? Marital status: Single  ?  Spouse name: Not on file  ? Number of children: Not on file  ? Years of education: Not on file  ? Highest education level: Not on file  ?Occupational History  ? Occupation: school/work  ?  Comment: school & work part time  ?Tobacco Use  ? Smoking status: Never  ? Smokeless tobacco: Never  ?Vaping Use  ? Vaping Use: Never used  ?Substance and Sexual Activity  ? Alcohol use: Not Currently  ? Drug use: Never  ? Sexual activity: Not Currently  ?Other Topics Concern  ? Not on file  ?Social History Narrative  ? Lives with mom and stepdad  ? Left Handed  ? Drinks 1-2 cups daily  ? ?Social Determinants of Health  ? ?Financial Resource Strain: Not on file  ?Food Insecurity: Not on file  ?Transportation Needs: Not on file  ?Physical Activity: Not on file  ?Stress: Not on file  ?Social Connections: Not on file  ?  ? ?Family History: ?The patient's Family history is unknown by patient. ? ?  ROS:   ?Please see the history of present illness.    ?All other systems reviewed and are negative. ? ?EKGs/Labs/Other Studies Reviewed:   ? ?The following studies were reviewed today: ?I discussed my findings with the patient at length. ? ? ?Recent Labs: ?No results found for requested labs within last 8760 hours.  ?Recent Lipid Panel ?No results found for: CHOL, TRIG, HDL, CHOLHDL, VLDL, LDLCALC, LDLDIRECT ? ?Physical Exam:   ? ?VS:  BP 112/62   Pulse 81   Ht 5' 7.5" (1.715 m)   Wt 164 lb (74.4 kg)   LMP 03/28/2021   SpO2 98%   BMI 25.31 kg/m?    ? ?Wt Readings from Last 3 Encounters:  ?04/13/21 164 lb (74.4 kg)  ?02/23/21 167 lb (75.8 kg)  ?04/03/20 167 lb 6.4 oz (75.9 kg)  ?  ? ?GEN: Patient is  in no acute distress ?HEENT: Normal ?NECK: No JVD; No carotid bruits ?LYMPHATICS: No lymphadenopathy ?CARDIAC: Hear sounds regular, 2/6 systolic murmur at the apex. ?RESPIRATORY:  Clear to auscultation without rales, wheezing or rhonchi  ?ABDOMEN: Soft, non-tender, non-distended ?MUSCULOSKELETAL:  No edema; No deformity  ?SKIN: Warm and dry ?NEUROLOGIC:  Alert and oriented x 3 ?PSYCHIATRIC:  Normal affect  ? ?Signed, ?Garwin Brothers, MD  ?04/13/2021 10:59 AM    ?[facility]  ?

## 2022-08-12 IMAGING — CR DG CHEST 2V
2 series · 2 of 2 positions shown · non-contrast
Comparison: None.

CLINICAL DATA: Chest pain

EXAM:
CHEST - 2 VIEW

[chest pa]
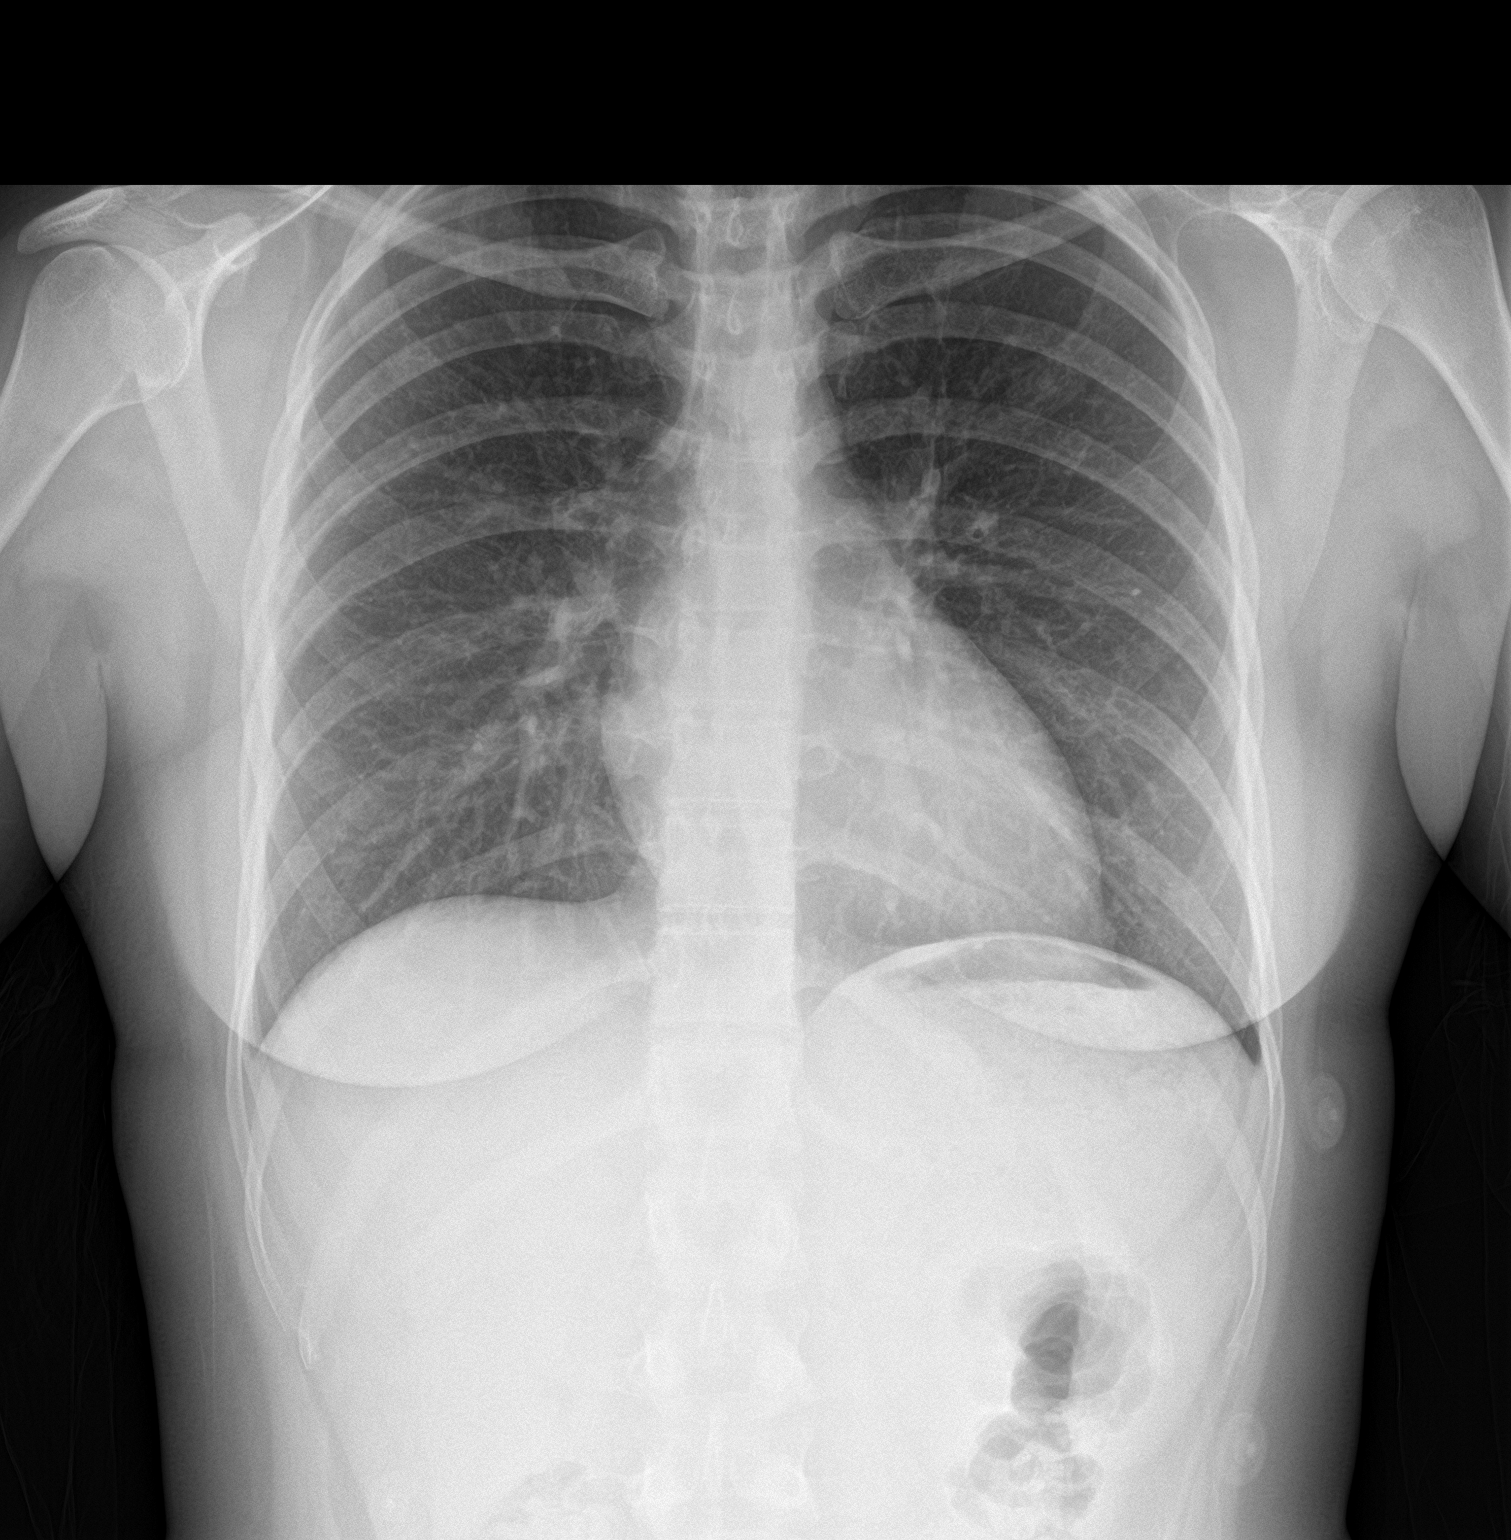

[chest lat]
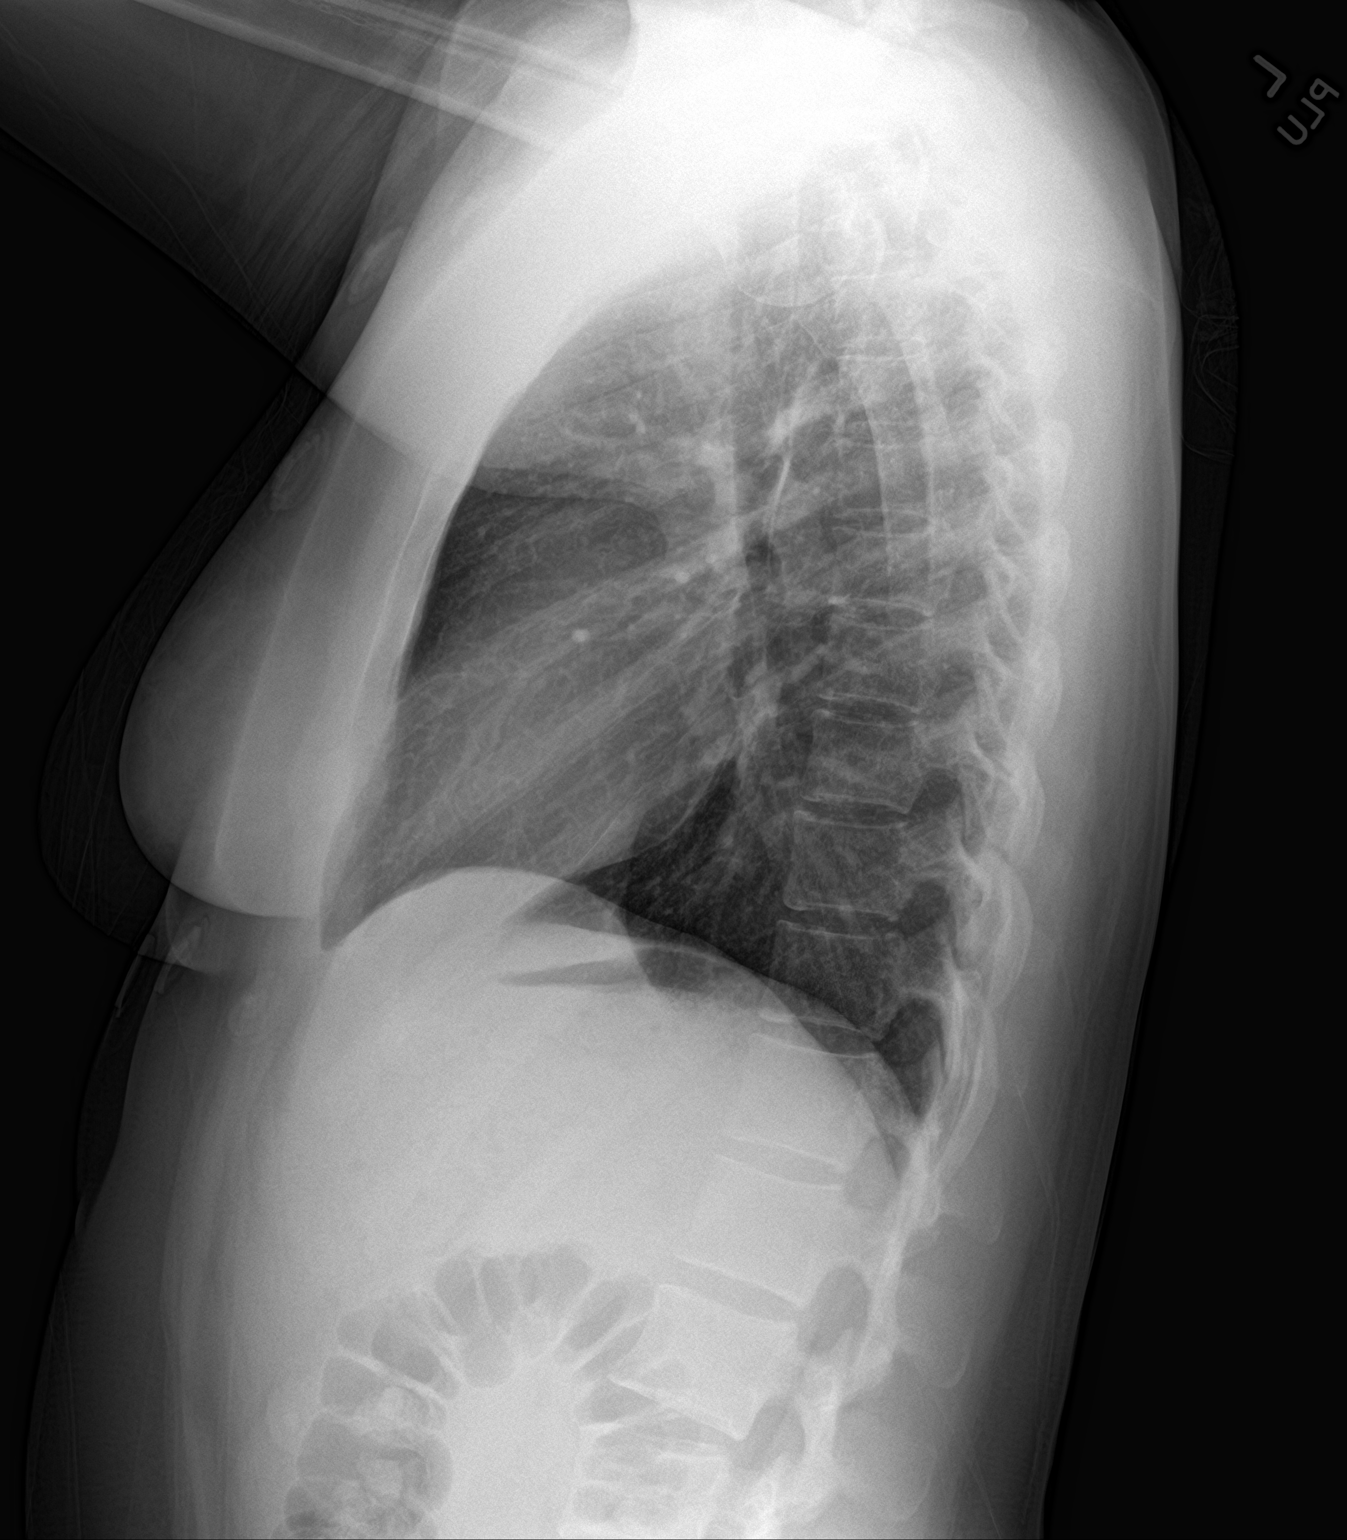

[2 of 2 positions shown; findings below may reference images not displayed]

FINDINGS: The heart size and mediastinal contours are within normal limits.
Both lungs are clear. The visualized skeletal structures are
unremarkable.
IMPRESSION: No active cardiopulmonary disease.
